# Patient Record
Sex: Female | Born: 1995 | Race: White | Hispanic: No | Marital: Single | Smoking: Never smoker
Health system: Southern US, Community
[De-identification: ages and names within clinical notes are randomized; demographics above are authoritative.]

## PROBLEM LIST (undated history)

## (undated) DIAGNOSIS — F329 Major depressive disorder, single episode, unspecified: Secondary | ICD-10-CM

## (undated) DIAGNOSIS — F419 Anxiety disorder, unspecified: Secondary | ICD-10-CM

## (undated) DIAGNOSIS — G43909 Migraine, unspecified, not intractable, without status migrainosus: Secondary | ICD-10-CM

## (undated) DIAGNOSIS — F32A Depression, unspecified: Secondary | ICD-10-CM

---
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
## 2019-06-13 ENCOUNTER — Encounter (HOSPITAL_COMMUNITY): Payer: Self-pay | Admitting: Emergency Medicine

## 2019-06-13 ENCOUNTER — Other Ambulatory Visit: Payer: Self-pay

## 2019-06-13 ENCOUNTER — Emergency Department (HOSPITAL_COMMUNITY)
Admission: EM | Admit: 2019-06-13 | Discharge: 2019-06-13 | Disposition: A | Discharge status: Home / Self Care | Payer: 59 | Attending: Emergency Medicine | Admitting: Emergency Medicine

## 2019-06-13 DIAGNOSIS — G43709 Chronic migraine without aura, not intractable, without status migrainosus: Secondary | ICD-10-CM

## 2019-06-13 DIAGNOSIS — G43719 Chronic migraine without aura, intractable, without status migrainosus: Secondary | ICD-10-CM | POA: Insufficient documentation

## 2019-06-13 DIAGNOSIS — R519 Headache, unspecified: Secondary | ICD-10-CM | POA: Diagnosis present

## 2019-06-13 LAB — CBC WITH DIFFERENTIAL/PLATELET
Abs Immature Granulocytes: 0.02 10*3/uL (ref 0.00–0.07)
Basophils Absolute: 0 10*3/uL (ref 0.0–0.1)
Basophils Relative: 0 %
Eosinophils Absolute: 0.2 10*3/uL (ref 0.0–0.5)
Eosinophils Relative: 3 %
HCT: 39.1 % (ref 36.0–46.0)
Hemoglobin: 12.2 g/dL (ref 12.0–15.0)
Immature Granulocytes: 0 %
Lymphocytes Relative: 36 %
Lymphs Abs: 2.7 10*3/uL (ref 0.7–4.0)
MCH: 25.4 pg — ABNORMAL LOW (ref 26.0–34.0)
MCHC: 31.2 g/dL (ref 30.0–36.0)
MCV: 81.5 fL (ref 80.0–100.0)
Monocytes Absolute: 0.5 10*3/uL (ref 0.1–1.0)
Monocytes Relative: 7 %
Neutro Abs: 4.1 10*3/uL (ref 1.7–7.7)
Neutrophils Relative %: 54 %
Platelets: 287 10*3/uL (ref 150–400)
RBC: 4.8 MIL/uL (ref 3.87–5.11)
RDW: 13.5 % (ref 11.5–15.5)
WBC: 7.6 10*3/uL (ref 4.0–10.5)
nRBC: 0 % (ref 0.0–0.2)

## 2019-06-13 LAB — BASIC METABOLIC PANEL
Anion gap: 12 (ref 5–15)
BUN: 8 mg/dL (ref 6–20)
CO2: 22 mmol/L (ref 22–32)
Calcium: 9.4 mg/dL (ref 8.9–10.3)
Chloride: 105 mmol/L (ref 98–111)
Creatinine, Ser: 0.73 mg/dL (ref 0.44–1.00)
GFR calc Af Amer: >60 mL/min (ref >60)
GFR calc non Af Amer: >60 mL/min (ref >60)
Glucose, Bld: 104 mg/dL — ABNORMAL HIGH (ref 70–99)
Potassium: 4.1 mmol/L (ref 3.5–5.1)
Sodium: 139 mmol/L (ref 135–145)

## 2019-06-13 LAB — I-STAT BETA HCG BLOOD, ED (MC, WL, AP ONLY): I-stat hCG, quantitative: <5 m[IU]/mL (ref <5)

## 2019-06-13 MED ORDER — PROCHLORPERAZINE EDISYLATE 10 MG/2ML IJ SOLN
10.0000 mg | Freq: Once | INTRAMUSCULAR | Status: AC
  Administered 2019-06-13: 09:00:00 10 mg via INTRAMUSCULAR
  Filled 2019-06-13: qty 2

## 2019-06-13 MED ORDER — DIPHENHYDRAMINE HCL 50 MG/ML IJ SOLN
25.0000 mg | Freq: Once | INTRAMUSCULAR | Status: AC
  Administered 2019-06-13: 25 mg via INTRAMUSCULAR
  Filled 2019-06-13: qty 1

## 2019-06-13 MED ORDER — DEXAMETHASONE 4 MG PO TABS
10.0000 mg | ORAL_TABLET | Freq: Once | ORAL | Status: AC
  Administered 2019-06-13: 10 mg via ORAL
  Filled 2019-06-13: qty 3

## 2019-06-13 NOTE — Discharge Instructions (Signed)
Return for worsening headache, fever, confusion, one sided weakness, numbness, difficulty with speech or swallowing.    I have put in a referral to a neurologist.  They should call you to set up an appointment.  You do not yet have a family doctor listed in our system, everyone needs a family physician.  There is a hotline number the call to try and set up an appointment for please ask family and friends who they see and try to set up an appointment.

## 2019-06-13 NOTE — ED Provider Notes (Signed)
[name] EMERGENCY DEPARTMENT Provider Note   CSN: [id number] Arrival date & time: 06/13/19  5784     History Chief Complaint  Patient presents with  . Migraine    Norma Mills is a 23 y.o. female.  23 yo F with a chief complaint of a headache.  Hx of similar.  Usually gets these about every 3 months. Feels similar.  Worse with bright lights.  Improves resting in a quiet room.  Mom has hx of same.  No one sided weakness or numbness.   The history is provided by the patient.  Migraine This is a new problem. The current episode started 2 days ago. The problem occurs constantly. The problem has not changed since onset.Associated symptoms include headaches. Pertinent negatives include no chest pain, no abdominal pain and no shortness of breath. Nothing aggravates the symptoms. Nothing relieves the symptoms. She has tried nothing for the symptoms. The treatment provided no relief.       History reviewed. No pertinent past medical history.  There are no problems to display for this patient.   History reviewed. No pertinent surgical history.   OB History   No obstetric history on file.     No family history on file.  Social History   Tobacco Use  . Smoking status: Never Smoker  . Smokeless tobacco: Never Used  Substance Use Topics  . Alcohol use: Never  . Drug use: Never    Home Medications Prior to Admission medications   Not on File    Allergies    Benadryl [diphenhydramine] and Sulfa antibiotics  Review of Systems   Review of Systems  Constitutional: Negative for chills and fever.  HENT: Negative for congestion and rhinorrhea.   Eyes: Positive for photophobia. Negative for redness and visual disturbance.  Respiratory: Negative for shortness of breath and wheezing.   Cardiovascular: Negative for chest pain and palpitations.  Gastrointestinal: Positive for nausea. Negative for abdominal pain and vomiting.  Genitourinary: Negative for  dysuria and urgency.  Musculoskeletal: Negative for arthralgias and myalgias.  Skin: Negative for pallor and wound.  Neurological: Positive for headaches. Negative for dizziness.    Physical Exam Updated Vital Signs BP 92/80 (BP Location: Left Arm)   Pulse (!) 109   Temp 98 F (36.7 C) (Oral)   LMP 05/18/2019   SpO2 97%   Physical Exam Vitals and nursing note reviewed.  Constitutional:      General: She is not in acute distress.    Appearance: She is well-developed. She is not diaphoretic.  HENT:     Head: Normocephalic and atraumatic.  Eyes:     Pupils: Pupils are equal, round, and reactive to light.  Cardiovascular:     Rate and Rhythm: Normal rate and regular rhythm.     Heart sounds: No murmur. No friction rub. No gallop.   Pulmonary:     Effort: Pulmonary effort is normal.     Breath sounds: No wheezing or rales.  Abdominal:     General: There is no distension.     Palpations: Abdomen is soft.     Tenderness: There is no abdominal tenderness.  Musculoskeletal:        General: No tenderness.     Cervical back: Normal range of motion and neck supple.  Skin:    General: Skin is warm and dry.  Neurological:     Mental Status: She is alert and oriented to person, place, and time.     GCS: GCS  eye subscore is 4. GCS verbal subscore is 5. GCS motor subscore is 6.     Cranial Nerves: Cranial nerves are intact.     Sensory: Sensation is intact.     Motor: Motor function is intact.     Coordination: Coordination is intact.     Gait: Gait is intact.     Comments: Benign neuro exam  Psychiatric:        Behavior: Behavior normal.     ED Results / Procedures / Treatments   Labs (all labs ordered are listed, but only abnormal results are displayed) Labs Reviewed  CBC WITH DIFFERENTIAL/PLATELET - Abnormal; Notable for the following components:      Result Value   MCH 25.4 (*)    All other components within normal limits  BASIC METABOLIC PANEL - Abnormal; Notable for  the following components:   Glucose, Bld 104 (*)    All other components within normal limits  I-STAT BETA HCG BLOOD, ED ([facility], [facility], [facility] ONLY)    EKG None  Radiology No results found.  Procedures Procedures (including critical care time)  Medications Ordered in ED Medications  prochlorperazine (COMPAZINE) injection 10 mg (10 mg Intramuscular Given 06/13/19 0833)  diphenhydrAMINE (BENADRYL) injection 25 mg (25 mg Intramuscular Given 06/13/19 0834)  dexamethasone (DECADRON) tablet 10 mg (10 mg Oral Given 06/13/19 5003)    ED Course  I have reviewed the triage vital signs and the nursing notes.  Pertinent labs & imaging results that were available during my care of the patient were reviewed by me and considered in my medical decision making (see chart for details).    MDM Rules/Calculators/A&P                      23 yo F with a  Chief complaint of a headache.  Hx of same.  Feels similar. Benign neuro exam.    Feeling better after headache cocktail.  Dc home.    9:13 AM:  I have discussed the diagnosis/risks/treatment options with the patient and believe the pt to be eligible for discharge home to follow-up with PCP, neuro. We also discussed returning to the ED immediately if new or worsening sx occur. We discussed the sx which are most concerning (e.g., sudden worsening pain, fever, inability to tolerate by mouth) that necessitate immediate return. Medications administered to the patient during their visit and any new prescriptions provided to the patient are listed below.  Medications given during this visit Medications  prochlorperazine (COMPAZINE) injection 10 mg (10 mg Intramuscular Given 06/13/19 0833)  diphenhydrAMINE (BENADRYL) injection 25 mg (25 mg Intramuscular Given 06/13/19 0834)  dexamethasone (DECADRON) tablet 10 mg (10 mg Oral Given 06/13/19 7048)     The patient appears reasonably screen and/or stabilized for discharge and I doubt any other medical condition or  other [facility] requiring further screening, evaluation, or treatment in the ED at this time prior to discharge.     Final Clinical Impression(s) / ED Diagnoses Final diagnoses:  Chronic migraine without aura without status migrainosus, not intractable    Rx / DC Orders ED Discharge Orders         Ordered    Ambulatory referral to Neurology    Comments: Hx of migraines never seen neuro   06/13/19 [place], [place], DO 06/13/19 [phone]

## 2019-06-13 NOTE — ED Triage Notes (Signed)
Patient reports left temporal migraine headache onset yesterday unrelieved by OTC pain medication with nausea and photophobia .

## 2019-06-20 ENCOUNTER — Other Ambulatory Visit: Payer: Self-pay

## 2019-06-20 ENCOUNTER — Encounter (HOSPITAL_COMMUNITY): Payer: Self-pay | Admitting: Emergency Medicine

## 2019-06-20 ENCOUNTER — Emergency Department (HOSPITAL_COMMUNITY)
Admission: EM | Admit: 2019-06-20 | Discharge: 2019-06-20 | Disposition: A | Discharge status: Home / Self Care | Payer: 59 | Attending: Emergency Medicine | Admitting: Emergency Medicine

## 2019-06-20 DIAGNOSIS — Z79899 Other long term (current) drug therapy: Secondary | ICD-10-CM | POA: Insufficient documentation

## 2019-06-20 DIAGNOSIS — R42 Dizziness and giddiness: Secondary | ICD-10-CM | POA: Insufficient documentation

## 2019-06-20 DIAGNOSIS — G4489 Other headache syndrome: Secondary | ICD-10-CM | POA: Diagnosis not present

## 2019-06-20 HISTORY — DX: Migraine, unspecified, not intractable, without status migrainosus: G43.909

## 2019-06-20 HISTORY — DX: Anxiety disorder, unspecified: F41.9

## 2019-06-20 HISTORY — DX: Depression, unspecified: F32.A

## 2019-06-20 LAB — POC URINE PREG, ED: Preg Test, Ur: NEGATIVE

## 2019-06-20 MED ORDER — MECLIZINE HCL 25 MG PO TABS: 25.0000 mg | ORAL_TABLET | Freq: Four times a day (QID) | ORAL | 0 refills | Status: AC

## 2019-06-20 MED ORDER — BUTALBITAL-APAP-CAFFEINE 50-325-40 MG PO TABS
1.0000 | ORAL_TABLET | Freq: Once | ORAL | Status: AC
  Administered 2019-06-20: 1 via ORAL
  Filled 2019-06-20: qty 1

## 2019-06-20 MED ORDER — BUTALBITAL-APAP-CAFFEINE 50-325-40 MG PO TABS: 1.0000 | ORAL_TABLET | Freq: Four times a day (QID) | ORAL | 0 refills | Status: AC | PRN

## 2019-06-20 MED ORDER — KETOROLAC TROMETHAMINE 30 MG/ML IJ SOLN
30.0000 mg | Freq: Once | INTRAMUSCULAR | Status: AC
  Administered 2019-06-20: 30 mg via INTRAVENOUS
  Filled 2019-06-20: qty 1

## 2019-06-20 MED ORDER — SODIUM CHLORIDE 0.9 % IV BOLUS
1000.0000 mL | Freq: Once | INTRAVENOUS | Status: AC
  Administered 2019-06-20: 1000 mL via INTRAVENOUS

## 2019-06-20 MED ORDER — DIAZEPAM 5 MG/ML IJ SOLN
5.0000 mg | Freq: Once | INTRAMUSCULAR | Status: AC
  Administered 2019-06-20: 13:00:00 5 mg via INTRAVENOUS
  Filled 2019-06-20: qty 2

## 2019-06-20 NOTE — Discharge Instructions (Addendum)
You were seen in the ER for a headache and dizziness  We discussed dizziness is likely from vertigo or changes in your chronic migraines  At this time there is no indication to suggest your having a stroke and no further imaging was needed  Use meclizine every 6 hours for dizziness related to vertigo.  Use Fioricet as needed for headaches that are not responding to ibuprofen or Tylenol  Keep your appointment with neurology, recommend ear nose and throat follow-up for ongoing dizziness that may be from vertigo  Return to the ER for worsening severe headache, double vision or visual disturbances, any stroke symptoms, worsening dizziness, passing out

## 2019-06-20 NOTE — ED Triage Notes (Signed)
Seen in ED on 12/22 for migraine headache.  C/o continued dizziness.  States she has headache every day and currently has mild headache.  Scheduled for virtual visit with neurologist on 1/11.

## 2019-06-20 NOTE — ED Notes (Signed)
Patient verbalizes understanding of discharge instructions. Opportunity for questioning and answers were provided. Armband removed by staff, pt discharged from ED ambulatory w/ family member 

## 2019-06-20 NOTE — ED Provider Notes (Signed)
[facility] EMERGENCY DEPARTMENT Provider Note   CSN: [id number] Arrival date & time: 06/20/19  1116     History Chief Complaint  Patient presents with  . Headache  . Dizziness    Norma Mills is a 23 y.o. female presents to the ER for evaluation of dizziness and headache.  Onset 12/22.  Describes dizziness as "feeling off", thinks dragging.  It is worse with standing up, turning her head.  States the dizziness is hard to describe, very "subtle" with certain movements.  No CP, SOB, palpitations, syncope.  Associated headache is right sided temporal 3/10 achy.  Reports long history of migraine headaches with strong family history of the same.  She developed a headache on 12/22 and came to the ER where they did IV migraine medicines, overall feels like the headache has almost completely resolved and now very mild.  Over the last few days she has taken ibuprofen and Tylenol PM and headache usually resolves.  Her biggest concern is the ongoing dizziness that has been going on since the headache began as well.  Typically her headaches cause photophobia, nausea and some dizziness but the dizziness this time has lasted for longer and feels a little bit more intense.  Also notes her last menses was lighter than normal and is concerned about possible pregnancy.  Denies any double vision or vision disturbances, difficulty with speech, difficulty with balance or walking, unilateral weakness or numbness.  HPI     Past Medical History:  Diagnosis Date  . Anxiety   . Depression   . Migraine     There are no problems to display for this patient.   History reviewed. No pertinent surgical history.   OB History   No obstetric history on file.     No family history on file.  Social History   Tobacco Use  . Smoking status: Never Smoker  . Smokeless tobacco: Never Used  Substance Use Topics  . Alcohol use: Never  . Drug use: Never    Home Medications Prior to  Admission medications   Medication Sig Start Date End Date Taking? Authorizing Provider  diphenhydramine-acetaminophen (TYLENOL PM) 25-500 MG TABS tablet Take 1-2 tablets by mouth at bedtime as needed (sleep).   Yes [provider]  ibuprofen (ADVIL) 200 MG tablet Take 600-800 mg by mouth every 6 (six) hours as needed for moderate pain.   Yes [provider]  norgestimate-ethinyl estradiol (ORTHO-CYCLEN) 0.25-35 MG-MCG tablet Take 1 tablet by mouth at bedtime.   Yes [provider]  sertraline (ZOLOFT) 100 MG tablet Take 100 mg by mouth at bedtime.   Yes [provider]  butalbital-acetaminophen-caffeine (FIORICET) [phone] MG tablet Take 1-2 tablets by mouth every 6 (six) hours as needed for headache. 06/20/19 06/19/20  Kinnie Feil, PA-C  meclizine (ANTIVERT) 25 MG tablet Take 1 tablet (25 mg total) by mouth 4 (four) times daily for 5 days. 06/20/19 06/25/19  Kinnie Feil, PA-C    Allergies    Benadryl [diphenhydramine] and Sulfa antibiotics  Review of Systems   Review of Systems  Eyes: Positive for photophobia.  Gastrointestinal: Positive for nausea.  Neurological: Positive for dizziness and headaches.  All other systems reviewed and are negative.   Physical Exam Updated Vital Signs BP 122/79 (BP Location: Right Arm)   Pulse 94   Temp 98.5 F (36.9 C) (Oral)   Resp 16   LMP 06/15/2019   SpO2 98%   Physical Exam Vitals and nursing note  reviewed.  Constitutional:      Appearance: She is well-developed.     Comments: NAD.  HENT:     Head: Normocephalic and atraumatic.     Right Ear: External ear normal.     Left Ear: External ear normal.     Nose: Nose normal.  Eyes:     Conjunctiva/sclera: Conjunctivae normal.     Comments: Subtle left horizontal nystagmus, resolves after a few seconds of fixed right gaze   Cardiovascular:     Rate and Rhythm: Normal rate and regular rhythm.     Heart sounds: Normal heart sounds.    Pulmonary:     Effort: Pulmonary effort is normal.     Breath sounds: Normal breath sounds.  Musculoskeletal:        General: No deformity. Normal range of motion.     Cervical back: Normal range of motion and neck supple.  Skin:    General: Skin is warm and dry.     Capillary Refill: Capillary refill takes less than 2 seconds.  Neurological:     Mental Status: She is alert and oriented to person, place, and time.     Comments:   Mental Status: Patient is awake, alert, oriented to person, place, year, and situation. Patient is able to give a clear and coherent history.  Speech is fluent and clear without dysarthria or aphasia. No signs of neglect.  Cranial Nerves: I not tested II visual fields full bilaterally. PERRL.  Unable to visualize posterior eye. III, IV, VI EOMs intact without ptosis or diplopia  V sensation to light touch intact in all 3 divisions of trigeminal nerve bilaterally  VII facial movements symmetric bilaterally VIII hearing intact to voice/conversation  IX, X no uvula deviation, symmetric rise of soft palate/uvula XI 5/5 SCM and trapezius strength bilaterally  XII tongue protrusion midline, symmetric L/R movements  Motor: Strength 5/5 in upper/lower extremities .   Sensation to light touch intact in face, upper/lower extremities. No pronator drift. No leg drop.  Cerebellar: No ataxia with finger to nose.  Steady gait. Normal heel to shin.   HINTS exam: Head impulse test: abnormal (corrective saccade to midline with rotation of head) Nystagmus: unidirectional left horizontal nystagmus Test of skew: no skew/verticval deviation  Psychiatric:        Behavior: Behavior normal.        Thought Content: Thought content normal.        Judgment: Judgment normal.     ED Results / Procedures / Treatments   Labs (all labs ordered are listed, but only abnormal results are displayed) Labs Reviewed  POC URINE PREG, ED    EKG None  Radiology No results  found.  Procedures Procedures (including critical care time)  Medications Ordered in ED Medications  sodium chloride 0.9 % bolus 1,000 mL (0 mLs Intravenous Stopped 06/20/19 1425)  ketorolac (TORADOL) 30 MG/ML injection 30 mg (30 mg Intravenous Given 06/20/19 1301)  butalbital-acetaminophen-caffeine (FIORICET) 50-325-40 MG per tablet 1 tablet (1 tablet Oral Given 06/20/19 1301)  diazepam (VALIUM) injection 5 mg (5 mg Intravenous Given 06/20/19 1301)    ED Course  I have reviewed the triage vital signs and the nursing notes.  Pertinent labs & imaging results that were available during my care of the patient were reviewed by me and considered in my medical decision making (see chart for details).    MDM Rules/Calculators/A&P  23 y.o. yo female with history of migraines presents with intermittent, mild bouts of dizziness described as "things dragging" with headache that is improving.   No red flag symptoms including focal weakness or sensory deficits to extremities, sudden onset/severe headache, associated LOC, pain anywhere, shortness of breath, chest pain, palpitations.    Neuro exam normal, HiNTS exam normal. Patient has dizziness with examination of EOMs and HINTS exam which supports more peripheral dizziness/etiology.    No diplopia, dysarthria, dysphagia, dysmetria, weakness or sensory loss to extremities.  I personally ambulated patient who had a steady gait without ataxia or sudden onset of nausea or vomiting.  I did not appreciate any tremors or shakiness while walking. ENT exam normal.  VS reassuring.    Patient given toradol, valium, IVF and had some improvement in dizziness.    I do not think further lab work or emergent imaging is indicated at this time.  I considered central cause of symptoms including stroke or TIA but this is highly unlikely in this patient.  her chronic migraines have cause dizziness to a lesser degree.  Doubt cardiac  arrhythmia  Will discharge with meclizine, fioricet.  She has neuro f/u with [facility] soon. Patient aware of red flag symptoms to monitor for that would warrant return to ED.   Final Clinical Impression(s) / ED Diagnoses Final diagnoses:  Dizziness  Headache syndrome    Rx / DC Orders ED Discharge Orders         Ordered    butalbital-acetaminophen-caffeine (FIORICET) 50-325-40 MG tablet  Every 6 hours PRN     06/20/19 1414    meclizine (ANTIVERT) 25 MG tablet  4 times daily     06/20/19 1414           Jerrell MylarGibbons, Jamiee Milholland J, PA-C 06/20/19 1604    Benjiman CorePickering, Nathan, MD 06/21/19 [phone]

## 2019-06-29 ENCOUNTER — Encounter: Payer: Self-pay | Admitting: Neurology

## 2019-06-30 NOTE — Progress Notes (Signed)
Virtual Visit via Video Note The purpose of this virtual visit is to provide medical care while limiting exposure to the novel coronavirus.    Consent was obtained for video visit:  Yes.   Answered questions that patient had about telehealth interaction:  Yes.   I discussed the limitations, risks, security and privacy concerns of performing an evaluation and management service by telemedicine. I also discussed with the patient that there may be a patient responsible charge related to this service. The patient expressed understanding and agreed to proceed.  Pt location: Home Physician Location: office Name of referring provider:  Melene Plan, DO I connected with Norma Mills at patients initiation/request on 07/03/2019 at 12:50 PM EST by video enabled telemedicine application and verified that I am speaking with the correct person using two identifiers. Pt MRN:  [id number] Pt DOB:  09/08/1995 Video Participants:  Norma Mills   History of Present Illness:  Norma Mills is a 24 year old female who presents for migraines.  History supplemented by ED notes.  Patient has history of migraine since childhood.  They are typically moderate to severe bifrontal/periorbital throbbing and pressure-like pain with nausea, photophobia and vague dizziness aggravated by movement.  There is no associated unilateral numbness or weakness.  There is preceding aura of vision loss.  They usually last 1 to 2 days and occur once a month.  Triggers typically include menses.  Relieving factors typically include resting upright in dark and quiet room.  Sometimes they are intractable requiring treatment with narcotics in the ED.  She presented to the ED on 06/13/2019 following 2 days of intractable migraine.  She was treated with headache cocktail of Compazine 10mg  IM, Benadryl 25mg  IM and Decadron 10mg  tablet.  She felt better afterward and was discharged in stable condition.  However, the dizziness persisted.  She  returned to the ED on 06/20/2019.  She was given toradol, Valium and IVF with some improvement and discharged with meclizine and Fioricet.  She continues to have dizziness, particularly she sees a dragging when she looks around.  It is difficult to drive.   CBC, BMP and pregnancy testing negative from 06/13/2019.   Current NSAIDS:  Ibuprofen 800mg  Current analgesics:  Fioricet Current triptans:  none Current ergotamine:  none Current anti-emetic:  none Current muscle relaxants:  none Current anti-anxiolytic:  none Current sleep aide:  Tylenol PM Current Antihypertensive medications:  none Current Antidepressant medications:  Sertraline 100mg  at bedtime Current Anticonvulsant medications:  none Current anti-CGRP:  none Current Vitamins/Herbal/Supplements:  none Current Antihistamines/Decongestants:  Meclizine; Tylenol PM Other therapy:  none Hormone/birth control:  Ortho-Cyclen  Past NSAIDS:  none Past analgesics:  Tylenol Past abortive triptans:  none Past abortive ergotamine:  none Past muscle relaxants:  none Past anti-emetic:  none Past antihypertensive medications:  none Past antidepressant medications:  none Past anticonvulsant medications:  none Past anti-CGRP:  none Past vitamins/Herbal/Supplements:  none Past antihistamines/decongestants:  none Other past therapies:  none  Caffeine:  No coffee.  Drinks 1 soda a day.  1 Red Bull once a week. Diet:  hydrates Exercise:  no Depression:  yes; Anxiety:  yes Other pain:  no Sleep hygiene:  Naps almost daily for 2 to 3 hours during the day.  Sleeps well at night.   Family history of headache:  Mom (migraines)   Past Medical History: Past Medical History:  Diagnosis Date  . Anxiety   . Depression   . Migraine     Medications: Outpatient Encounter  Medications as of 07/03/2019  Medication Sig  . butalbital-acetaminophen-caffeine (FIORICET) 50-325-40 MG tablet Take 1-2 tablets by mouth every 6 (six) hours as needed  for headache.  . diphenhydramine-acetaminophen (TYLENOL PM) 25-500 MG TABS tablet Take 1-2 tablets by mouth at bedtime as needed (sleep).  Marland Kitchen ibuprofen (ADVIL) 200 MG tablet Take 600-800 mg by mouth every 6 (six) hours as needed for moderate pain.  . norgestimate-ethinyl estradiol (ORTHO-CYCLEN) 0.25-35 MG-MCG tablet Take 1 tablet by mouth at bedtime.  . sertraline (ZOLOFT) 100 MG tablet Take 100 mg by mouth at bedtime.   No facility-administered encounter medications on file as of 07/03/2019.    Allergies: Allergies  Allergen Reactions  . Benadryl [Diphenhydramine]   . Sulfa Antibiotics     Family History: History reviewed. No pertinent family history.  Social History: Social History   Socioeconomic History  . Marital status: Single    Spouse name: Not on file  . Number of children: Not on file  . Years of education: Not on file  . Highest education level: Not on file  Occupational History  . Not on file  Tobacco Use  . Smoking status: Never Smoker  . Smokeless tobacco: Never Used  Substance and Sexual Activity  . Alcohol use: Never  . Drug use: Never  . Sexual activity: Not on file  Other Topics Concern  . Not on file  Social History Narrative   Right handed   One story   Occasional caffeine   Social Determinants of Health   Financial Resource Strain:   . Difficulty of Paying Living Expenses: Not on file  Food Insecurity:   . Worried About Programme researcher, broadcasting/film/video in the Last Year: Not on file  . Ran Out of Food in the Last Year: Not on file  Transportation Needs:   . Lack of Transportation (Medical): Not on file  . Lack of Transportation (Non-Medical): Not on file  Physical Activity:   . Days of Exercise per Week: Not on file  . Minutes of Exercise per Session: Not on file  Stress:   . Feeling of Stress : Not on file  Social Connections:   . Frequency of Communication with Friends and Family: Not on file  . Frequency of Social Gatherings with Friends and  Family: Not on file  . Attends Religious Services: Not on file  . Active Member of Clubs or Organizations: Not on file  . Attends Banker Meetings: Not on file  . Marital Status: Not on file  Intimate Partner Violence:   . Fear of Current or Ex-Partner: Not on file  . Emotionally Abused: Not on file  . Physically Abused: Not on file  . Sexually Abused: Not on file    Observations/Objective:   Height 5\' 6"  (1.676 m), weight 250 lb (113.4 kg), last menstrual period 06/15/2019. No acute distress.  Alert and oriented.  Speech fluent and not dysarthric.  Language intact.  Eyes orthophoric on primary gaze.  Face symmetric.  Assessment and Plan:   Migraine with aura, without status migrainosus, not intractable.  It seems that she is with a prolonged postdrome.  1. Due to prolonged postdrome/disequilibrium, will check MRI of brain  2. For preventative management, start topiramate titrating to 50mg  at bedtime 3.  For abortive therapy, rizatriptan 10mg .  Advised not to take Fioricet for now. 4.  Limit use of pain relievers to no more than 2 days out of week to prevent risk of rebound or medication-overuse headache. 5.  Keep headache diary 6.  Exercise, hydration, caffeine cessation, sleep hygiene, monitor for and avoid triggers 7.  Consider:  magnesium citrate 400mg  daily, riboflavin 400mg  daily, and coenzyme Q10 100mg  three times daily 8. Follow up 4 months.   Follow Up Instructions:    -I discussed the assessment and treatment plan with the patient. The patient was provided an opportunity to ask questions and all were answered. The patient agreed with the plan and demonstrated an understanding of the instructions.   The patient was advised to call back or seek an in-person evaluation if the symptoms worsen or if the condition fails to improve as anticipated.   Dudley Major, DO

## 2019-07-03 ENCOUNTER — Telehealth (INDEPENDENT_AMBULATORY_CARE_PROVIDER_SITE_OTHER): Payer: 59 | Admitting: Neurology

## 2019-07-03 ENCOUNTER — Other Ambulatory Visit: Payer: Self-pay

## 2019-07-03 ENCOUNTER — Encounter: Payer: Self-pay | Admitting: Neurology

## 2019-07-03 VITALS — Ht 66.0 in | Wt 250.0 lb

## 2019-07-03 DIAGNOSIS — R42 Dizziness and giddiness: Secondary | ICD-10-CM

## 2019-07-03 DIAGNOSIS — G43109 Migraine with aura, not intractable, without status migrainosus: Secondary | ICD-10-CM

## 2019-07-03 DIAGNOSIS — R519 Headache, unspecified: Secondary | ICD-10-CM

## 2019-07-03 MED ORDER — TOPIRAMATE 25 MG PO TABS: ORAL_TABLET | ORAL | 0 refills | Status: DC

## 2019-07-03 MED ORDER — RIZATRIPTAN BENZOATE 10 MG PO TABS: 10.0000 mg | ORAL_TABLET | ORAL | 3 refills | Status: AC | PRN

## 2019-07-10 ENCOUNTER — Telehealth: Payer: Self-pay | Admitting: Neurology

## 2019-07-10 NOTE — Telephone Encounter (Signed)
Called voice mail left with [facility] imaging number so she can call to schedule MRI

## 2019-07-10 NOTE — Telephone Encounter (Signed)
Patient called and said she was seen a week ago and told to call if she did not hear from anyone to schedule her MRI.   To date, she is still waiting to hear from that facility and is reaching out for assistance scheduling her appointment.

## 2019-07-18 ENCOUNTER — Telehealth: Payer: Self-pay | Admitting: Neurology

## 2019-07-18 NOTE — Telephone Encounter (Signed)
Patient called regarding her Insurance needing a Gap Exception Code? They are needing an ICD 10 Code, CT Code and a Tax ID Code. Please call. Thank you

## 2019-07-18 NOTE — Telephone Encounter (Signed)
Can you give any insite to this? 

## 2019-07-20 ENCOUNTER — Telehealth: Payer: Self-pay

## 2019-07-20 NOTE — Telephone Encounter (Signed)
We can see if the MRI can be moved up but since the start of the pandemic, MRIs have been scheduling out to 4 to 6 weeks.

## 2019-07-20 NOTE — Telephone Encounter (Signed)
toprimate has helped with migraines. [phone], upset and anxious, having a hard time with dizziness and foggy. Patient MRI isnt until the 28 of feb, I advised to call to see if they have cancellatios. Please advise on any further recommendations.

## 2019-07-20 NOTE — Telephone Encounter (Signed)
Pt called

## 2019-07-22 ENCOUNTER — Other Ambulatory Visit: Payer: 59

## 2019-07-24 ENCOUNTER — Other Ambulatory Visit: Payer: 59

## 2019-07-24 NOTE — Telephone Encounter (Signed)
Close encounter 

## 2019-07-25 ENCOUNTER — Other Ambulatory Visit: Payer: 59

## 2019-07-28 ENCOUNTER — Ambulatory Visit
Admission: RE | Admit: 2019-07-28 | Discharge: 2019-07-28 | Disposition: A | Discharge status: Home / Self Care | Payer: 59 | Source: Ambulatory Visit | Attending: Neurology | Admitting: Neurology

## 2019-07-28 ENCOUNTER — Telehealth: Payer: Self-pay | Admitting: Neurology

## 2019-07-28 ENCOUNTER — Other Ambulatory Visit: Payer: Self-pay

## 2019-07-28 DIAGNOSIS — R42 Dizziness and giddiness: Secondary | ICD-10-CM

## 2019-07-28 DIAGNOSIS — R519 Headache, unspecified: Secondary | ICD-10-CM

## 2019-07-28 NOTE — Telephone Encounter (Signed)
Patient wants to ask some questions about her MRI results. Thanks!

## 2019-07-31 ENCOUNTER — Other Ambulatory Visit: Payer: Self-pay

## 2019-07-31 ENCOUNTER — Other Ambulatory Visit: Payer: 59

## 2019-07-31 ENCOUNTER — Telehealth: Payer: Self-pay | Admitting: Neurology

## 2019-07-31 MED ORDER — TOPIRAMATE 25 MG PO TABS: ORAL_TABLET | ORAL | 0 refills | Status: DC

## 2019-07-31 NOTE — Telephone Encounter (Signed)
Patient left msg with after hours about having MRI done and having some follow up questions about the results. She also is needing a medication refill. Did not give medication name of pharm. Thanks!

## 2019-07-31 NOTE — Telephone Encounter (Signed)
Tried to call back , if patient calls back in please see what information she is questioning on MRI

## 2019-07-31 NOTE — Telephone Encounter (Signed)
No answer needs to call pharmacy for refill request.

## 2019-07-31 NOTE — Telephone Encounter (Signed)
Patient called to report her topiramate 25 MG is working well and she'd like to continue taking it. She said she tried it for one month and was told to call back with a report before getting refills.   Patient wants Rx to [facility] on [address] in 90 day increments.  Patient aware a nurse will call her back.

## 2019-08-15 ENCOUNTER — Telehealth: Payer: Self-pay | Admitting: Neurology

## 2019-08-15 ENCOUNTER — Other Ambulatory Visit: Payer: Self-pay

## 2019-08-15 MED ORDER — TOPIRAMATE 25 MG PO TABS: 25.0000 mg | ORAL_TABLET | Freq: Every day | ORAL | 5 refills | Status: DC

## 2019-08-15 NOTE — Telephone Encounter (Signed)
Patient states that we need to call in a new Refill for the RX topirmate. It should be 50mg  and we called in the 25 mg please call in the correct dosage   [facility]

## 2019-08-15 NOTE — Telephone Encounter (Signed)
Sent to Dr.Jaffe to sign  

## 2019-09-05 ENCOUNTER — Telehealth: Payer: Self-pay | Admitting: Neurology

## 2019-09-05 ENCOUNTER — Other Ambulatory Visit: Payer: Self-pay | Admitting: Neurology

## 2019-09-05 MED ORDER — TOPIRAMATE 50 MG PO TABS: 50.0000 mg | ORAL_TABLET | Freq: Every day | ORAL | 3 refills | Status: DC

## 2019-09-05 NOTE — Telephone Encounter (Signed)
Please send in new rx thanks

## 2019-09-05 NOTE — Telephone Encounter (Signed)
Topiramate 50mg  at bedtime script sent to [facility] on [facility]

## 2019-09-05 NOTE — Telephone Encounter (Signed)
Pt needs aRX for the topamax sent in to the [facility]. She states that he started her off at 25 mg for 1 week and then changed her to 50 mg  And we keep sending the 25 mg she needs RX for the 50mg 

## 2019-09-23 ENCOUNTER — Other Ambulatory Visit: Payer: Self-pay

## 2019-09-23 ENCOUNTER — Encounter (HOSPITAL_COMMUNITY): Payer: Self-pay

## 2019-09-23 ENCOUNTER — Emergency Department (HOSPITAL_COMMUNITY)
Admission: EM | Admit: 2019-09-23 | Discharge: 2019-09-23 | Disposition: A | Discharge status: Home / Self Care | Payer: 59 | Attending: Emergency Medicine | Admitting: Emergency Medicine

## 2019-09-23 DIAGNOSIS — R110 Nausea: Secondary | ICD-10-CM | POA: Diagnosis not present

## 2019-09-23 DIAGNOSIS — Z79899 Other long term (current) drug therapy: Secondary | ICD-10-CM | POA: Insufficient documentation

## 2019-09-23 DIAGNOSIS — E669 Obesity, unspecified: Secondary | ICD-10-CM | POA: Diagnosis not present

## 2019-09-23 DIAGNOSIS — Z6839 Body mass index (BMI) 39.0-39.9, adult: Secondary | ICD-10-CM | POA: Diagnosis not present

## 2019-09-23 DIAGNOSIS — R42 Dizziness and giddiness: Secondary | ICD-10-CM | POA: Insufficient documentation

## 2019-09-23 DIAGNOSIS — R519 Headache, unspecified: Secondary | ICD-10-CM | POA: Diagnosis not present

## 2019-09-23 LAB — CBC WITH DIFFERENTIAL/PLATELET
Abs Immature Granulocytes: 0.03 10*3/uL (ref 0.00–0.07)
Basophils Absolute: 0 10*3/uL (ref 0.0–0.1)
Basophils Relative: 0 %
Eosinophils Absolute: 0.2 10*3/uL (ref 0.0–0.5)
Eosinophils Relative: 2 %
HCT: 43.2 % (ref 36.0–46.0)
Hemoglobin: 13.4 g/dL (ref 12.0–15.0)
Immature Granulocytes: 0 %
Lymphocytes Relative: 21 %
Lymphs Abs: 2.2 10*3/uL (ref 0.7–4.0)
MCH: 25.3 pg — ABNORMAL LOW (ref 26.0–34.0)
MCHC: 31 g/dL (ref 30.0–36.0)
MCV: 81.7 fL (ref 80.0–100.0)
Monocytes Absolute: 0.5 10*3/uL (ref 0.1–1.0)
Monocytes Relative: 5 %
Neutro Abs: 7.5 10*3/uL (ref 1.7–7.7)
Neutrophils Relative %: 72 %
Platelets: 312 10*3/uL (ref 150–400)
RBC: 5.29 MIL/uL — ABNORMAL HIGH (ref 3.87–5.11)
RDW: 13.7 % (ref 11.5–15.5)
WBC: 10.5 10*3/uL (ref 4.0–10.5)
nRBC: 0 % (ref 0.0–0.2)

## 2019-09-23 LAB — TSH: TSH: 2.054 u[IU]/mL (ref 0.350–4.500)

## 2019-09-23 MED ORDER — SODIUM CHLORIDE 0.9 % IV BOLUS
1000.0000 mL | Freq: Once | INTRAVENOUS | Status: AC
  Administered 2019-09-23: 1000 mL via INTRAVENOUS

## 2019-09-23 MED ORDER — DIPHENHYDRAMINE HCL 50 MG/ML IJ SOLN
12.5000 mg | Freq: Once | INTRAMUSCULAR | Status: AC
  Administered 2019-09-23: 12.5 mg via INTRAVENOUS
  Filled 2019-09-23: qty 1

## 2019-09-23 MED ORDER — PROCHLORPERAZINE EDISYLATE 10 MG/2ML IJ SOLN
10.0000 mg | Freq: Once | INTRAMUSCULAR | Status: AC
  Administered 2019-09-23: 10 mg via INTRAVENOUS
  Filled 2019-09-23: qty 2

## 2019-09-23 MED ORDER — KETOROLAC TROMETHAMINE 15 MG/ML IJ SOLN
15.0000 mg | Freq: Once | INTRAMUSCULAR | Status: AC
  Administered 2019-09-23: 15 mg via INTRAVENOUS
  Filled 2019-09-23: qty 1

## 2019-09-23 MED ORDER — MAGNESIUM SULFATE 2 GM/50ML IV SOLN
2.0000 g | Freq: Once | INTRAVENOUS | Status: AC
  Administered 2019-09-23: 2 g via INTRAVENOUS
  Filled 2019-09-23: qty 50

## 2019-09-23 NOTE — ED Notes (Signed)
Patient Alert and oriented to baseline. Stable and ambulatory to baseline. Patient verbalized understanding of the discharge instructions.  Patient belongings were taken by the patient.   

## 2019-09-23 NOTE — ED Triage Notes (Signed)
Onset 11am today headache "all over", nausea, and dizziness.   Pt has h/o migraines- is on Topamax and takes fioricet; and was seen at ENT for dizziness and was told it was chronic dysequilibrium.

## 2019-09-25 NOTE — ED Provider Notes (Signed)
[facility] EMERGENCY DEPARTMENT Provider Note   CSN: [id number] Arrival date & time: 09/23/19  1410     History Chief Complaint  Patient presents with  . Headache    Norma Mills is a 24 y.o. female.  HPI   24 year old female with headache.  Acute on chronic.  Past history of migraines.  Has been on various medications previously.  Also complaining of dizziness which is chronic as well.  She has been evaluated by different neurologist as well as ENT.  She was told that she has chronic disequilibrium.  Today she began having worsening headache around 11 AM.  Is diffuse.  Associated with nausea.  No vomiting.  No fevers or chills.  No acute visual complaints.  No confusion per visitor at bedside.  No neck pain or neck stiffness.  Patient is pretty anxious.  She is concerned about having a bad reaction to recent Covid vaccination although this was several days ago.  She is also requesting blood work including a CBC and TSH.  Crying at times.  Past Medical History:  Diagnosis Date  . Anxiety   . Depression   . Migraine     There are no problems to display for this patient.   History reviewed. No pertinent surgical history.   OB History   No obstetric history on file.     History reviewed. No pertinent family history.  Social History   Tobacco Use  . Smoking status: Never Smoker  . Smokeless tobacco: Never Used  Substance Use Topics  . Alcohol use: Never  . Drug use: Never    Home Medications Prior to Admission medications   Medication Sig Start Date End Date Taking? Authorizing Provider  busPIRone (BUSPAR) 5 MG tablet Take 5 mg by mouth as needed (anxiety).  08/14/19  Yes [provider]  butalbital-acetaminophen-caffeine (FIORICET) 50-325-40 MG tablet Take 1-2 tablets by mouth every 6 (six) hours as needed for headache. 06/20/19 06/19/20 Yes Liberty Handy, PA-C  Coenzyme Q10 (COQ10 PO) Take 1 tablet by mouth daily.   Yes [provider]  Cyanocobalamin (VITAMIN B-12 PO) Take 1 tablet by mouth daily.   Yes [provider]  MAGNESIUM PO Take 1 tablet by mouth daily.   Yes [provider]  norgestimate-ethinyl estradiol (ESTARYLLA) 0.25-35 MG-MCG tablet Take 1 tablet by mouth at bedtime.   Yes [provider]  rizatriptan (MAXALT) 10 MG tablet Take 1 tablet (10 mg total) by mouth as needed for migraine (May repeat dose after 2 hours if needed.  Maximum 2 tablets in 24 hours.). May repeat in 2 hours if needed 07/03/19  Yes Jaffe, Adam R, DO  topiramate (TOPAMAX) 50 MG tablet Take 1 tablet (50 mg total) by mouth at bedtime. 09/05/19  Yes Everlena Cooper, Adam R, DO  venlafaxine XR (EFFEXOR-XR) 150 MG 24 hr capsule Take 150 mg by mouth daily. 09/13/19  Yes [provider]    Allergies    Benadryl [diphenhydramine] and Sulfa antibiotics  Review of Systems   Review of Systems All systems reviewed and negative, other than as noted in HPI.  Physical Exam Updated Vital Signs BP 116/68   Pulse 83   Temp 98.7 F (37.1 C) (Oral)   Resp 20   Ht 5\' 6"  (1.676 m)   Wt 111.1 kg   LMP 09/07/2019   SpO2 100%   BMI 39.54 kg/m   Physical Exam Vitals and nursing note reviewed.  Constitutional:  General: She is not in acute distress.    Appearance: She is well-developed. She is obese.  HENT:     Head: Normocephalic and atraumatic.  Eyes:     General:        Right eye: No discharge.        Left eye: No discharge.     Conjunctiva/sclera: Conjunctivae normal.  Cardiovascular:     Rate and Rhythm: Normal rate and regular rhythm.     Heart sounds: Normal heart sounds. No murmur. No friction rub. No gallop.      Comments: No nuchal rigidity Pulmonary:     Effort: Pulmonary effort is normal. No respiratory distress.     Breath sounds: Normal breath sounds.  Abdominal:     General: There is no distension.     Palpations: Abdomen is soft.     Tenderness: There is no abdominal tenderness.    Musculoskeletal:        General: No tenderness.     Cervical back: Neck supple.  Skin:    General: Skin is warm and dry.  Neurological:     General: No focal deficit present.     Mental Status: She is alert and oriented to person, place, and time.  Psychiatric:        Behavior: Behavior normal.        Thought Content: Thought content normal.     ED Results / Procedures / Treatments   Labs (all labs ordered are listed, but only abnormal results are displayed) Labs Reviewed  CBC WITH DIFFERENTIAL/PLATELET - Abnormal; Notable for the following components:      Result Value   RBC 5.29 (*)    MCH 25.3 (*)    All other components within normal limits  TSH    EKG None  Radiology No results found.  Procedures Procedures (including critical care time)  Medications Ordered in ED Medications  sodium chloride 0.9 % bolus 1,000 mL (0 mLs Intravenous Stopped 09/23/19 1746)  ketorolac (TORADOL) 15 MG/ML injection 15 mg (15 mg Intravenous Given 09/23/19 1611)  prochlorperazine (COMPAZINE) injection 10 mg (10 mg Intravenous Given 09/23/19 1610)  diphenhydrAMINE (BENADRYL) injection 12.5 mg (12.5 mg Intravenous Given 09/23/19 1611)  magnesium sulfate IVPB 2 g 50 mL (0 g Intravenous Stopped 09/23/19 1746)    ED Course  I have reviewed the triage vital signs and the nursing notes.  Pertinent labs & imaging results that were available during my care of the patient were reviewed by me and considered in my medical decision making (see chart for details).    MDM Rules/Calculators/A&P                      24 year old female with headache.  Acute on chronic.  Symptomatic treatment with much improvement.  Neuro exam is nonfocal.  I suspect there may be some anxiety component.  Crying at times.  Feeling better by the time of discharge though.  Multiple other complaints.  TSH and CBC done essentially diffuse patient.  They were reassuring.  Return precautions were discussed.  Outpatient  follow-up.  Final Clinical Impression(s) / ED Diagnoses Final diagnoses:  Nonintractable headache, unspecified chronicity pattern, unspecified headache type    Rx / DC Orders ED Discharge Orders    None       Virgel Manifold, MD 09/25/19 0130

## 2019-11-06 NOTE — Progress Notes (Deleted)
NEUROLOGY FOLLOW UP OFFICE NOTE  Norma Mills [id number]  HISTORY OF PRESENT ILLNESS: Norma Mills is a 24 year old female who follows up for migraines and chronic disequilibrium.  UPDATE: MRI ***  Started on topiramate in January.  She noted improvement.  She did need to go to the ED on 09/23/2019 for headache cocktail.   Intensity:  *** Duration:  *** Frequency:  *** Frequency of abortive medication: *** Current NSAIDS:  Ibuprofen 800mg  Current analgesics:  none Current triptans:  rizatriptan 10mg  Current ergotamine:  none Current anti-emetic:  none Current muscle relaxants:  none Current anti-anxiolytic:  none Current sleep aide:  Tylenol PM Current Antihypertensive medications:  none Current Antidepressant medications:  Sertraline 100mg  at bedtime Current Anticonvulsant medications:  topiramate 50mg  at bedtime Current anti-CGRP:  none Current Vitamins/Herbal/Supplements:  none Current Antihistamines/Decongestants:  Meclizine; Tylenol PM Other therapy:  none Hormone/birth control:  Ortho-Cyclen  Caffeine:  No coffee.  Drinks 1 soda a day.  1 Red Bull once a week. Diet:  hydrates Exercise:  no Depression:  yes; Anxiety:  yes Other pain:  no Sleep hygiene:  Naps almost daily for 2 to 3 hours during the day.  Sleeps well at night.    HISTORY: Patient has history of migraine since childhood.  They are typically moderate to severe bifrontal/periorbital throbbing and pressure-like pain with nausea, photophobia and vague dizziness aggravated by movement.  There is no associated unilateral numbness or weakness.  There is preceding aura of vision loss.  They usually last 1 to 2 days and occur once a month.  Triggers typically include menses.  Relieving factors typically include resting upright in dark and quiet room.  Sometimes they are intractable requiring treatment with narcotics in the ED.  She presented to the ED on 06/13/2019 following 2 days of intractable migraine.   She was treated with headache cocktail of Compazine 10mg  IM, Benadryl 25mg  IM and Decadron 10mg  tablet.  She felt better afterward and was discharged in stable condition.  However, the dizziness persisted.  She returned to the ED on 06/20/2019.  She was given toradol, Valium and IVF with some improvement and discharged with meclizine and Fioricet.  She continues to have dizziness, particularly she sees a dragging when she looks around.  It is difficult to drive.    Past NSAIDS:  none Past analgesics:  Tylenol; Fioricet Past abortive triptans:  none Past abortive ergotamine:  none Past muscle relaxants:  none Past anti-emetic:  none Past antihypertensive medications:  none Past antidepressant medications:  none Past anticonvulsant medications:  none Past anti-CGRP:  none Past vitamins/Herbal/Supplements:  none Past antihistamines/decongestants:  none Other past therapies:  none   Family history of headache:  Mom (migraines)  PAST MEDICAL HISTORY: Past Medical History:  Diagnosis Date  . Anxiety   . Depression   . Migraine     MEDICATIONS: Current Outpatient Medications on File Prior to Visit  Medication Sig Dispense Refill  . busPIRone (BUSPAR) 5 MG tablet Take 5 mg by mouth as needed (anxiety).     . butalbital-acetaminophen-caffeine (FIORICET) 50-325-40 MG tablet Take 1-2 tablets by mouth every 6 (six) hours as needed for headache. 20 tablet 0  . Coenzyme Q10 (COQ10 PO) Take 1 tablet by mouth daily.    . Cyanocobalamin (VITAMIN B-12 PO) Take 1 tablet by mouth daily.    MAGNESIUM PO Take 1 tablet by mouth daily.    . norgestimate-ethinyl estradiol (ESTARYLLA) 0.25-35 MG-MCG tablet Take 1 tablet by mouth at  bedtime.    . rizatriptan (MAXALT) 10 MG tablet Take 1 tablet (10 mg total) by mouth as needed for migraine (May repeat dose after 2 hours if needed.  Maximum 2 tablets in 24 hours.). May repeat in 2 hours if needed 10 tablet 3  . topiramate (TOPAMAX) 50 MG tablet Take 1  tablet (50 mg total) by mouth at bedtime. 30 tablet 3  . venlafaxine XR (EFFEXOR-XR) 150 MG 24 hr capsule Take 150 mg by mouth daily.     No current facility-administered medications on file prior to visit.    ALLERGIES: Allergies  Allergen Reactions  . Benadryl [Diphenhydramine]   . Sulfa Antibiotics     FAMILY HISTORY: No family history on file. ***.  SOCIAL HISTORY: Social History   Socioeconomic History  . Marital status: Single    Spouse name: Not on file  . Number of children: Not on file  . Years of education: Not on file  . Highest education level: Not on file  Occupational History  . Not on file  Tobacco Use  . Smoking status: Never Smoker  . Smokeless tobacco: Never Used  Substance and Sexual Activity  . Alcohol use: Never  . Drug use: Never  . Sexual activity: Not on file  Other Topics Concern  . Not on file  Social History Narrative   Right handed   One story   Occasional caffeine   Social Determinants of Health   Financial Resource Strain:   . Difficulty of Paying Living Expenses:   Food Insecurity:   . Worried About Charity fundraiser in the Last Year:   . Arboriculturist in the Last Year:   Transportation Needs:   . Film/video editor (Medical):   Marland Kitchen Lack of Transportation (Non-Medical):   Physical Activity:   . Days of Exercise per Week:   . Minutes of Exercise per Session:   Stress:   . Feeling of Stress :   Social Connections:   . Frequency of Communication with Friends and Family:   . Frequency of Social Gatherings with Friends and Family:   . Attends Religious Services:   . Active Member of Clubs or Organizations:   . Attends Archivist Meetings:   Marland Kitchen Marital Status:   Intimate Partner Violence:   . Fear of Current or Ex-Partner:   . Emotionally Abused:   Marland Kitchen Physically Abused:   . Sexually Abused:     REVIEW OF SYSTEMS: Constitutional: No fevers, chills, or sweats, no generalized fatigue, change in appetite Eyes:  No visual changes, double vision, eye pain Ear, nose and throat: No hearing loss, ear pain, nasal congestion, sore throat Cardiovascular: No chest pain, palpitations Respiratory:  No shortness of breath at rest or with exertion, wheezes GastrointestinaI: No nausea, vomiting, diarrhea, abdominal pain, fecal incontinence Genitourinary:  No dysuria, urinary retention or frequency Musculoskeletal:  No neck pain, back pain Integumentary: No rash, pruritus, skin lesions Neurological: as above Psychiatric: No depression, insomnia, anxiety Endocrine: No palpitations, fatigue, diaphoresis, mood swings, change in appetite, change in weight, increased thirst Hematologic/Lymphatic:  No purpura, petechiae. Allergic/Immunologic: no itchy/runny eyes, nasal congestion, recent allergic reactions, rashes  PHYSICAL EXAM: *** General: No acute distress.  Patient appears ***-groomed.   Head:  Normocephalic/atraumatic Eyes:  Fundi examined but not visualized Neck: supple, no paraspinal tenderness, full range of motion Heart:  Regular rate and rhythm Lungs:  Clear to auscultation bilaterally Back: No paraspinal tenderness Neurological Exam: alert and oriented to person,  place, and time. Attention span and concentration intact, recent and remote memory intact, fund of knowledge intact.  Speech fluent and not dysarthric, language intact.  CN II-XII intact. Bulk and tone normal, muscle strength 5/5 throughout.  Sensation to light touch, temperature and vibration intact.  Deep tendon reflexes 2+ throughout, toes downgoing.  Finger to nose and heel to shin testing intact.  Gait normal, Romberg negative.  IMPRESSION: ***  PLAN: ***  Shon Millet, DO  CC: ***

## 2019-11-07 ENCOUNTER — Ambulatory Visit: Payer: 59 | Admitting: Neurology

## 2020-02-02 ENCOUNTER — Other Ambulatory Visit: Payer: Self-pay | Admitting: Neurology

## 2020-02-02 MED ORDER — TOPIRAMATE 50 MG PO TABS: 50.0000 mg | ORAL_TABLET | Freq: Every day | ORAL | 3 refills | Status: AC

## 2020-07-05 ENCOUNTER — Other Ambulatory Visit: Payer: Self-pay

## 2020-07-05 ENCOUNTER — Encounter (HOSPITAL_COMMUNITY): Payer: Self-pay | Admitting: Emergency Medicine

## 2020-07-05 ENCOUNTER — Emergency Department (HOSPITAL_COMMUNITY)
Admission: EM | Admit: 2020-07-05 | Discharge: 2020-07-05 | Disposition: A | Discharge status: Home / Self Care | Payer: 59 | Attending: Emergency Medicine | Admitting: Emergency Medicine

## 2020-07-05 DIAGNOSIS — Y990 Civilian activity done for income or pay: Secondary | ICD-10-CM | POA: Insufficient documentation

## 2020-07-05 DIAGNOSIS — W540XXA Bitten by dog, initial encounter: Secondary | ICD-10-CM | POA: Insufficient documentation

## 2020-07-05 DIAGNOSIS — Z5321 Procedure and treatment not carried out due to patient leaving prior to being seen by health care provider: Secondary | ICD-10-CM | POA: Insufficient documentation

## 2020-07-05 DIAGNOSIS — U071 COVID-19: Secondary | ICD-10-CM | POA: Insufficient documentation

## 2020-07-05 DIAGNOSIS — R509 Fever, unspecified: Secondary | ICD-10-CM | POA: Diagnosis present

## 2020-07-05 LAB — BASIC METABOLIC PANEL
Anion gap: 10 (ref 5–15)
BUN: 10 mg/dL (ref 6–20)
CO2: 18 mmol/L — ABNORMAL LOW (ref 22–32)
Calcium: 9.2 mg/dL (ref 8.9–10.3)
Chloride: 109 mmol/L (ref 98–111)
Creatinine, Ser: 0.82 mg/dL (ref 0.44–1.00)
GFR, Estimated: >60 mL/min (ref >60)
Glucose, Bld: 110 mg/dL — ABNORMAL HIGH (ref 70–99)
Potassium: 3.4 mmol/L — ABNORMAL LOW (ref 3.5–5.1)
Sodium: 137 mmol/L (ref 135–145)

## 2020-07-05 LAB — CBC WITH DIFFERENTIAL/PLATELET
Abs Immature Granulocytes: 0.03 10*3/uL (ref 0.00–0.07)
Basophils Absolute: 0 10*3/uL (ref 0.0–0.1)
Basophils Relative: 0 %
Eosinophils Absolute: 0.1 10*3/uL (ref 0.0–0.5)
Eosinophils Relative: 1 %
HCT: 40.5 % (ref 36.0–46.0)
Hemoglobin: 12.8 g/dL (ref 12.0–15.0)
Immature Granulocytes: 0 %
Lymphocytes Relative: 5 %
Lymphs Abs: 0.5 10*3/uL — ABNORMAL LOW (ref 0.7–4.0)
MCH: 26 pg (ref 26.0–34.0)
MCHC: 31.6 g/dL (ref 30.0–36.0)
MCV: 82.3 fL (ref 80.0–100.0)
Monocytes Absolute: 0.8 10*3/uL (ref 0.1–1.0)
Monocytes Relative: 8 %
Neutro Abs: 8.3 10*3/uL — ABNORMAL HIGH (ref 1.7–7.7)
Neutrophils Relative %: 86 %
Platelets: 285 10*3/uL (ref 150–400)
RBC: 4.92 MIL/uL (ref 3.87–5.11)
RDW: 13.2 % (ref 11.5–15.5)
WBC: 9.7 10*3/uL (ref 4.0–10.5)
nRBC: 0 % (ref 0.0–0.2)

## 2020-07-05 LAB — RESP PANEL BY RT-PCR (FLU A&B, COVID) ARPGX2
Influenza A by PCR: NEGATIVE
Influenza B by PCR: NEGATIVE
SARS Coronavirus 2 by RT PCR: POSITIVE — AB

## 2020-07-05 NOTE — ED Notes (Signed)
Pt left the ED and left her stickers at the desk.

## 2020-07-05 NOTE — ED Triage Notes (Signed)
Patient coming in from home. Patient complaint of fever, body aches, chills, and n/v that started this morning. Patient was working with dog yesterday as well and got bit on the hand, that broke the skin. Unsure if dog was up to date on shots.

## 2020-07-05 NOTE — ED Notes (Signed)
Patient reports taking tylenol @ 1540

## 2020-10-08 IMAGING — MR MR HEAD W/O CM
10 series · 48 of 48 positions shown · non-contrast
Comparison: None.

CLINICAL DATA: Migraine with dizziness that affects riding.

EXAM:
MRI HEAD WITHOUT CONTRAST
TECHNIQUE: Multiplanar, multiecho pulse sequences of the brain and surrounding
structures were obtained without intravenous contrast.

[Series 2: T1 · sagittal · 5.0mm · 0.45mm/px · 3 of 21 slices shown]
[im 1/21]
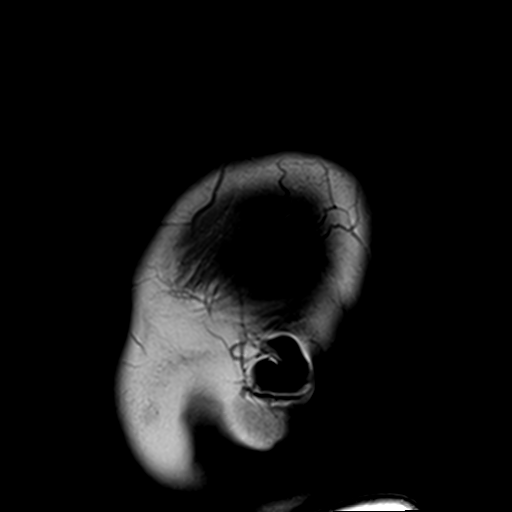
[im 11/21]
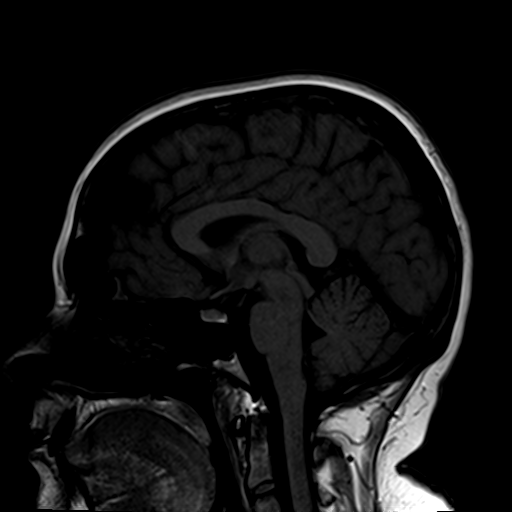
[im 21/21]
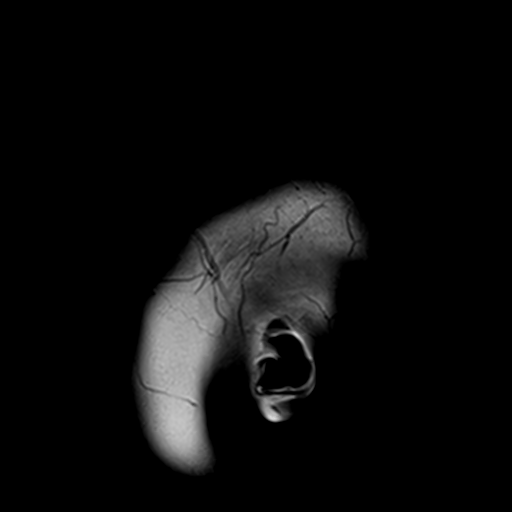

[Series 3: DWI · axial · 3.0mm · 1.80mm/px · z∈[-57,+90]mm · 9 of 100 slices shown (1 of 4)]
[im 1/100]
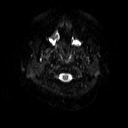
[im 13/100]
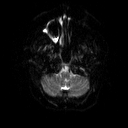
[im 25/100]
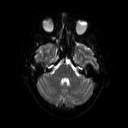
[im 38/100]
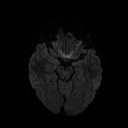
[im 50/100]
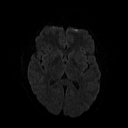
[im 62/100]
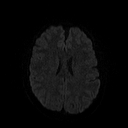
[im 75/100]
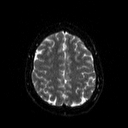
[im 87/100]
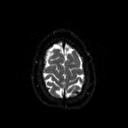
[im 100/100]
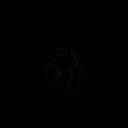

[Series 4: DWI · axial · 3.0mm · 1.80mm/px · z∈[-57,+90]mm · 4 of 50 slices shown (2 of 4)]
[im 1/50]
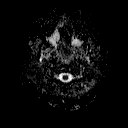
[im 17/50]
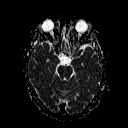
[im 33/50]
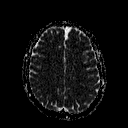
[im 50/50]
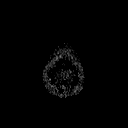

[Series 5: DWI · coronal · 5.0mm · 1.80mm/px · 6 of 69 slices shown (3 of 4)]
[im 1/69]
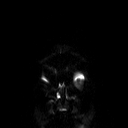
[im 14/69]
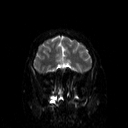
[im 28/69]
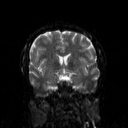
[im 41/69]
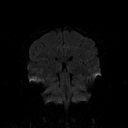
[im 55/69]
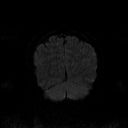
[im 69/69]
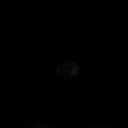

[Series 6: DWI · coronal · 5.0mm · 1.80mm/px · 3 of 35 slices shown (4 of 4)]
[im 1/35]
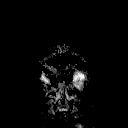
[im 18/35]
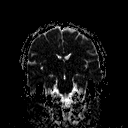
[im 35/35]
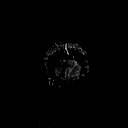

[Series 7: T2 · axial · 5.0mm · 0.51mm/px · z∈[-58,+89]mm · 2 of 22 slices shown (1 of 2)]
[im 1/22]
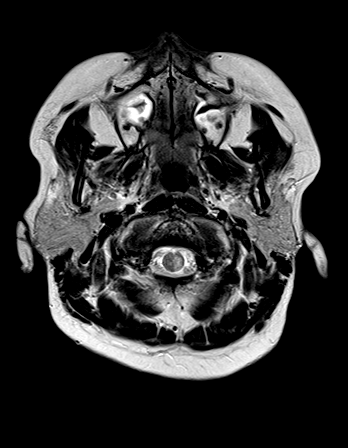
[im 22/22]
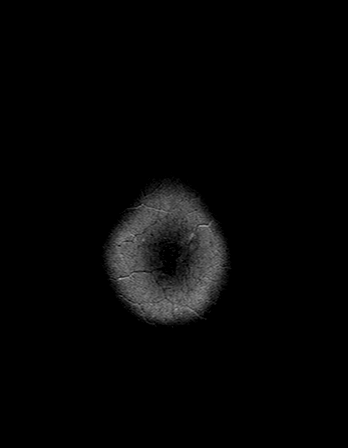

[Series 8: FLAIR · axial · 3.0mm · 0.45mm/px · z∈[-53,+91]mm · 3 of 32 slices shown]
[im 1/32]
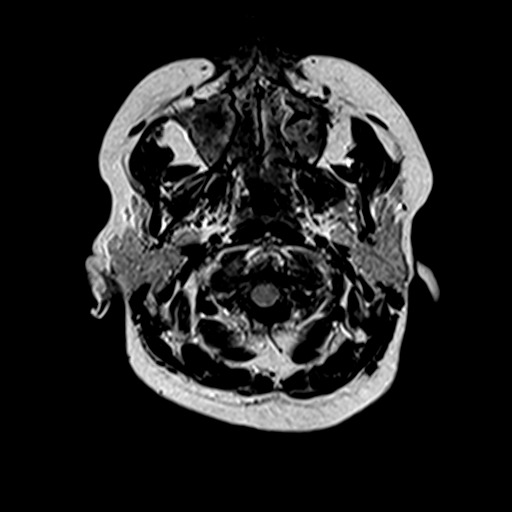
[im 16/32]
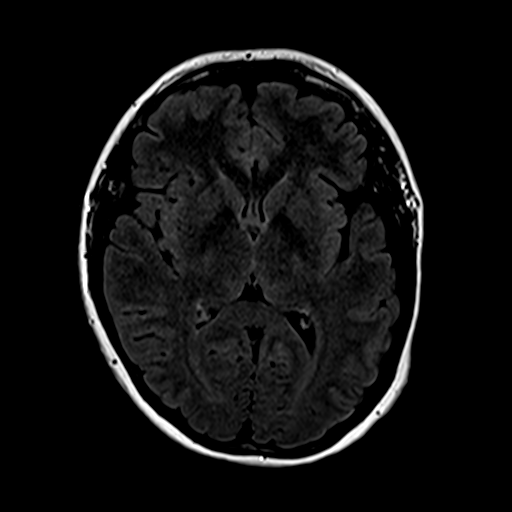
[im 32/32]
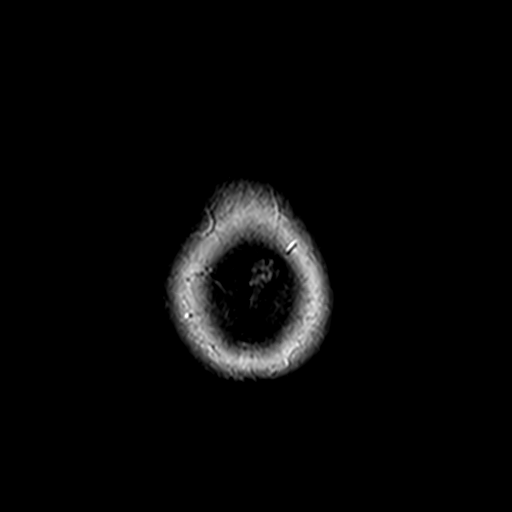

[Series 10: swi_images · axial · 4.0mm · 0.90mm/px · z∈[-53,+87]mm · 3 of 36 slices shown]
[im 1/36]
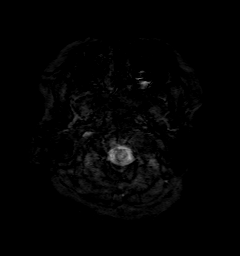
[im 18/36]
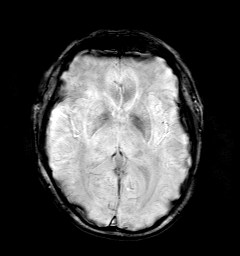
[im 36/36]
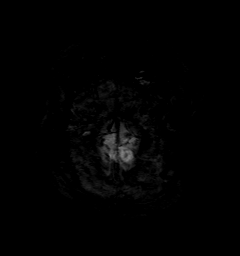

[Series 11: t1_mpr_tra · axial · 1.0mm · 0.71mm/px · z∈[-50,+92]mm · 13 of 144 slices shown]
[im 1/144]
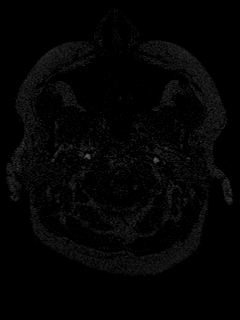
[im 12/144]
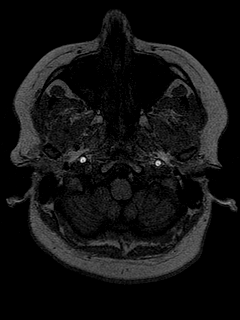
[im 24/144]
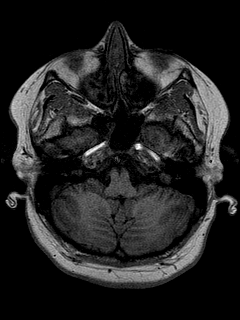
[im 36/144]
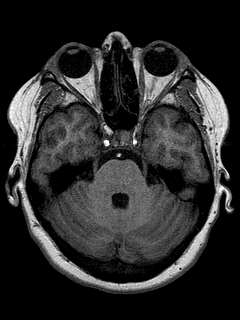
[im 48/144]
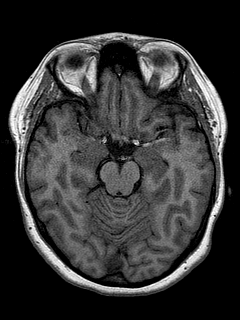
[im 60/144]
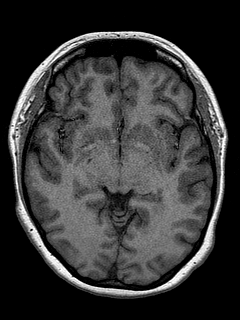
[im 72/144]
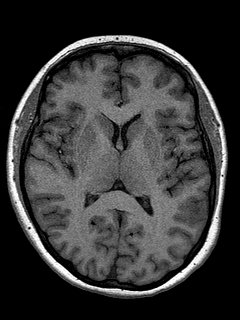
[im 84/144]
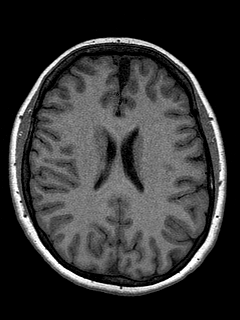
[im 96/144]
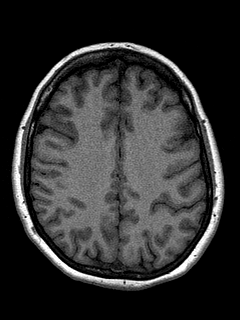
[im 108/144]
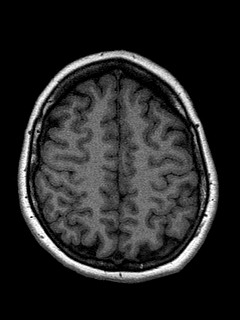
[im 120/144]
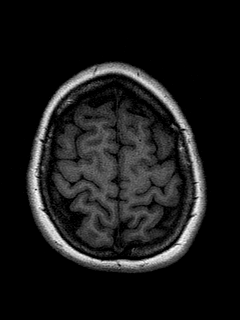
[im 132/144]
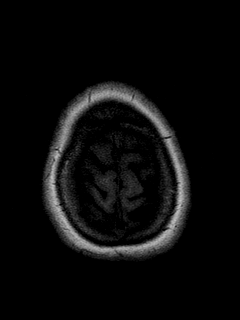
[im 144/144]
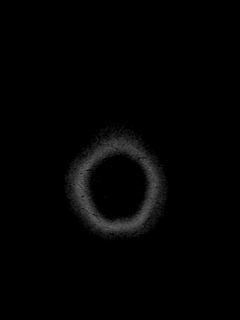

[Series 12: T2 · coronal · 5.0mm · 0.45mm/px · 2 of 27 slices shown (2 of 2)]
[im 1/27]
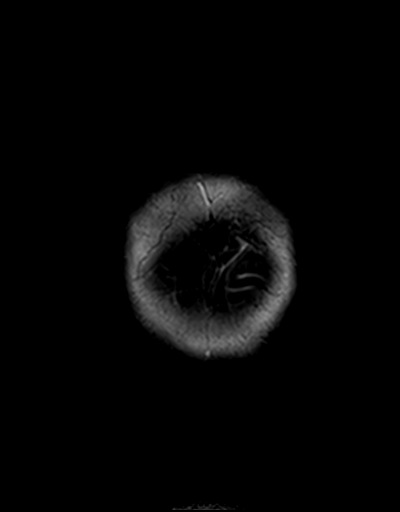
[im 27/27]
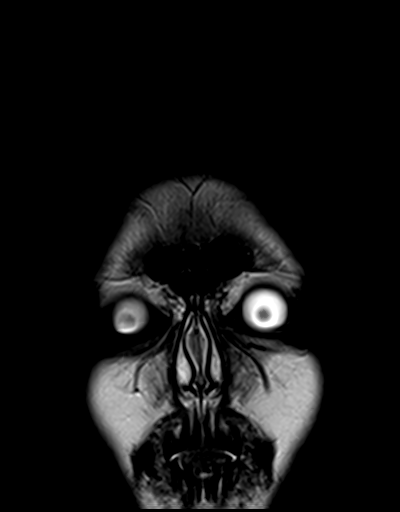

[48 of 48 positions shown; findings below may reference images not displayed]

FINDINGS: Brain: On axial T2 weighted imaging there is a T2 hyperintensity
over the left cord without underlying swelling. No correlate on the
other sequences and this is attributed to artifact.

No infarct, hemorrhage, hydrocephalus, or mass.

A CSF intensity collection is seen left para median in the anterior
hemispheric fissure measuring 20 x 7 x 21 mm.

Vascular: Normal flow voids

Skull and upper cervical spine: Normal marrow signal

Sinuses/Orbits: Sinusitis with mucosal mainly in the bilateral
maxillary and right sphenoid sinuses, with secretions in the right
maxillary sinus.
IMPRESSION: 1. 2 cm arachnoid cyst in anterior interhemispheric fissure.
2. Active sinusitis.
